# Patient Record
Sex: Female | Born: 1955 | Race: White | Hispanic: No | Marital: Married | Smoking: Current every day smoker
Health system: Southern US, Community
[De-identification: ages and names within clinical notes are randomized; demographics above are authoritative.]

## PROBLEM LIST (undated history)

## (undated) DIAGNOSIS — I341 Nonrheumatic mitral (valve) prolapse: Secondary | ICD-10-CM

## (undated) DIAGNOSIS — E538 Deficiency of other specified B group vitamins: Secondary | ICD-10-CM

## (undated) DIAGNOSIS — D510 Vitamin B12 deficiency anemia due to intrinsic factor deficiency: Secondary | ICD-10-CM

## (undated) HISTORY — PX: TONSILLECTOMY: SUR1361

## (undated) HISTORY — PX: URETHRAL STRICTURE DILATATION: SHX477

## (undated) HISTORY — PX: TUBAL LIGATION: SHX77

---

## 2020-04-07 ENCOUNTER — Encounter: Payer: Self-pay | Admitting: Emergency Medicine

## 2020-04-07 ENCOUNTER — Emergency Department: Payer: Medicare Other

## 2020-04-07 ENCOUNTER — Other Ambulatory Visit: Payer: Self-pay

## 2020-04-07 ENCOUNTER — Emergency Department
Admission: EM | Admit: 2020-04-07 | Discharge: 2020-04-07 | Disposition: A | Discharge status: Home / Self Care | Payer: Medicare Other | Attending: Emergency Medicine | Admitting: Emergency Medicine

## 2020-04-07 DIAGNOSIS — R609 Edema, unspecified: Secondary | ICD-10-CM | POA: Diagnosis not present

## 2020-04-07 DIAGNOSIS — F17210 Nicotine dependence, cigarettes, uncomplicated: Secondary | ICD-10-CM | POA: Insufficient documentation

## 2020-04-07 DIAGNOSIS — R2243 Localized swelling, mass and lump, lower limb, bilateral: Secondary | ICD-10-CM | POA: Diagnosis present

## 2020-04-07 DIAGNOSIS — R0602 Shortness of breath: Secondary | ICD-10-CM | POA: Insufficient documentation

## 2020-04-07 DIAGNOSIS — R7989 Other specified abnormal findings of blood chemistry: Secondary | ICD-10-CM

## 2020-04-07 HISTORY — DX: Vitamin B12 deficiency anemia due to intrinsic factor deficiency: D51.0

## 2020-04-07 HISTORY — DX: Nonrheumatic mitral (valve) prolapse: I34.1

## 2020-04-07 HISTORY — DX: Deficiency of other specified B group vitamins: E53.8

## 2020-04-07 LAB — CBC WITH DIFFERENTIAL/PLATELET
Abs Immature Granulocytes: 0.01 10*3/uL (ref 0.00–0.07)
Basophils Absolute: 0.1 10*3/uL (ref 0.0–0.1)
Basophils Relative: 1 %
Eosinophils Absolute: 0.2 10*3/uL (ref 0.0–0.5)
Eosinophils Relative: 3 %
HCT: 44.1 % (ref 36.0–46.0)
Hemoglobin: 14.6 g/dL (ref 12.0–15.0)
Immature Granulocytes: 0 %
Lymphocytes Relative: 26 %
Lymphs Abs: 2.1 10*3/uL (ref 0.7–4.0)
MCH: 29.8 pg (ref 26.0–34.0)
MCHC: 33.1 g/dL (ref 30.0–36.0)
MCV: 90 fL (ref 80.0–100.0)
Monocytes Absolute: 0.6 10*3/uL (ref 0.1–1.0)
Monocytes Relative: 7 %
Neutro Abs: 5.1 10*3/uL (ref 1.7–7.7)
Neutrophils Relative %: 63 %
Platelets: 240 10*3/uL (ref 150–400)
RBC: 4.9 MIL/uL (ref 3.87–5.11)
RDW: 13.6 % (ref 11.5–15.5)
WBC: 8.1 10*3/uL (ref 4.0–10.5)
nRBC: 0 % (ref 0.0–0.2)

## 2020-04-07 LAB — COMPREHENSIVE METABOLIC PANEL
ALT: 14 U/L (ref 0–44)
AST: 18 U/L (ref 15–41)
Albumin: 4.1 g/dL (ref 3.5–5.0)
Alkaline Phosphatase: 63 U/L (ref 38–126)
Anion gap: 10 (ref 5–15)
BUN: 26 mg/dL — ABNORMAL HIGH (ref 8–23)
CO2: 26 mmol/L (ref 22–32)
Calcium: 8.9 mg/dL (ref 8.9–10.3)
Chloride: 106 mmol/L (ref 98–111)
Creatinine, Ser: 1.73 mg/dL — ABNORMAL HIGH (ref 0.44–1.00)
GFR calc Af Amer: 35 mL/min — ABNORMAL LOW (ref >60)
GFR calc non Af Amer: 30 mL/min — ABNORMAL LOW (ref >60)
Glucose, Bld: 87 mg/dL (ref 70–99)
Potassium: 4.2 mmol/L (ref 3.5–5.1)
Sodium: 142 mmol/L (ref 135–145)
Total Bilirubin: 0.7 mg/dL (ref 0.3–1.2)
Total Protein: 7.4 g/dL (ref 6.5–8.1)

## 2020-04-07 LAB — BRAIN NATRIURETIC PEPTIDE: B Natriuretic Peptide: 65 pg/mL (ref 0.0–100.0)

## 2020-04-07 NOTE — Discharge Instructions (Signed)
The only significant abnormal lab findings were an elevated BUN and Creatinine, which are kidney numbers.   BUN 8 - 23 mg/dL 65KCLE    Creatinine, Ser 0.44 - 1.00 mg/dL 1.73High     This could be related to dehydration or multiple things. However, with your BP being high today and your swelling, it's important to have this re-checked with your Primary doctor.  Stand up and move around AT LEAST every 2-3 hours while driving

## 2020-04-07 NOTE — ED Triage Notes (Signed)
Patient presents to the ED with left leg swelling and occasional pain.  Patient denies history of blood clots.  Patient reports driving from [place] prior to swelling.

## 2020-04-07 NOTE — ED Provider Notes (Signed)
[facility] Emergency Department Provider Note  ____________________________________________   First MD Initiated Contact with Patient 04/07/20 1600     (approximate)  I have reviewed the triage vital signs and the nursing notes.   HISTORY  Chief Complaint Leg Swelling    HPI Amanda Weaver is a 64 y.o. female  Here with leg swelling.   The patient is currently visiting from [place].  She reportedly just drove down to visit family.  Per report, she often gets leg swelling when traveling.  However, over the last day, she has noticed worsening left leg swelling more than right, which is somewhat atypical for her.  Her daughter, who works in healthcare, told her to come to the ED due to concern for DVT.  Patient has no history of DVT/PE.  No history of kidney or heart issues.  Denies any recent immobilization or injuries.  She does try to get up, but admits to not getting up more than once or twice every 4-6 hours.  Denies any chest pain or shortness of breath.  No lightheadedness or dizziness.  No other acute complaints.       Past Medical History:  Diagnosis Date  . B12 deficiency   . Mitral valve prolapse   . Pernicious anemia     There are no problems to display for this patient.     Prior to Admission medications   Not on File    Allergies Patient has no allergy information on record.  No family history on file.  Social History Social History   Tobacco Use  . Smoking status: Current Every Day Smoker    Packs/day: 0.50    Types: Cigarettes  . Smokeless tobacco: Never Used  Substance Use Topics  . Alcohol use: Not Currently  . Drug use: Not on file    Review of Systems  Review of Systems  Constitutional: Negative for chills and fever.  HENT: Negative for sore throat.   Respiratory: Negative for shortness of breath.   Cardiovascular: Positive for leg swelling. Negative for chest pain.  Gastrointestinal: Negative for abdominal  pain.  Genitourinary: Negative for flank pain.  Musculoskeletal: Negative for neck pain.  Skin: Negative for rash and wound.  Allergic/Immunologic: Negative for immunocompromised state.  Neurological: Negative for weakness and numbness.  Hematological: Does not bruise/bleed easily.  All other systems reviewed and are negative.    ____________________________________________  PHYSICAL EXAM:      VITAL SIGNS: ED Triage Vitals  Enc Vitals Group     BP 04/07/20 1329 (!) 189/97     Pulse Rate 04/07/20 1329 (!) 104     Resp 04/07/20 1329 16     Temp 04/07/20 1329 98.3 F (36.8 C)     Temp Source 04/07/20 1329 Oral     SpO2 04/07/20 1329 95 %     Weight 04/07/20 1325 180 lb (81.6 kg)     Height 04/07/20 1325 5\' 2"  (1.575 m)     Head Circumference --      [facility] Flow --      Pain Score --      Pain Loc --      Pain Edu? --      Excl. in [place]? --      Physical Exam Vitals and nursing note reviewed.  Constitutional:      General: She is not in acute distress.    Appearance: She is well-developed.  HENT:     Head: Normocephalic and atraumatic.  Eyes:     Conjunctiva/sclera: Conjunctivae normal.  Cardiovascular:     Rate and Rhythm: Normal rate and regular rhythm.     Heart sounds: Normal heart sounds. No murmur. No friction rub.  Pulmonary:     Effort: Pulmonary effort is normal. No respiratory distress.     Breath sounds: Normal breath sounds. No wheezing or rales.  Abdominal:     General: There is no distension.     Palpations: Abdomen is soft.     Tenderness: There is no abdominal tenderness.  Musculoskeletal:     Cervical back: Neck supple.     Right lower leg: Edema present.     Left lower leg: Edema (Trace, non pitting) present.  Skin:    General: Skin is warm.     Capillary Refill: Capillary refill takes less than 2 seconds.  Neurological:     Mental Status: She is alert and oriented to person, place, and time.     Motor: No abnormal muscle tone.     Comments:  Strength 5/5 bl LE, normal sensation to light touch       ____________________________________________   LABS (all labs ordered are listed, but only abnormal results are displayed)  Labs Reviewed  COMPREHENSIVE METABOLIC PANEL - Abnormal; Notable for the following components:      Result Value   BUN 26 (*)    Creatinine, Ser 1.73 (*)    GFR calc non Af Amer 30 (*)    GFR calc Af Amer 35 (*)    All other components within normal limits  CBC WITH DIFFERENTIAL/PLATELET  BRAIN NATRIURETIC PEPTIDE    ____________________________________________  EKG: None ________________________________________  RADIOLOGY All imaging, including plain films, CT scans, and ultrasounds, independently reviewed by me, and interpretations confirmed via formal radiology reads.  ED MD interpretation:   US DVT: No DVT, likely lipoma noted  Official radiology report(s): US Venous Img Lower Unilateral Left  Result Date: 04/07/2020 CLINICAL DATA:  Left lower extremity pain and edema. Recent travel. History of smoking. Evaluate for DVT. EXAM: LEFT LOWER EXTREMITY VENOUS DOPPLER ULTRASOUND TECHNIQUE: Gray-scale sonography with graded compression, as well as color Doppler and duplex ultrasound were performed to evaluate the lower extremity deep venous systems from the level of the common femoral vein and including the common femoral, femoral, profunda femoral, popliteal and calf veins including the posterior tibial, peroneal and gastrocnemius veins when visible. The superficial great saphenous vein was also interrogated. Spectral Doppler was utilized to evaluate flow at rest and with distal augmentation maneuvers in the common femoral, femoral and popliteal veins. COMPARISON:  None. FINDINGS: Contralateral Common Femoral Vein: Respiratory phasicity is normal and symmetric with the symptomatic side. No evidence of thrombus. Normal compressibility. Common Femoral Vein: No evidence of thrombus. Normal compressibility,  respiratory phasicity and response to augmentation. Saphenofemoral Junction: No evidence of thrombus. Normal compressibility and flow on color Doppler imaging. Profunda Femoral Vein: No evidence of thrombus. Normal compressibility and flow on color Doppler imaging. Femoral Vein: No evidence of thrombus. Normal compressibility, respiratory phasicity and response to augmentation. Popliteal Vein: No evidence of thrombus. Normal compressibility, respiratory phasicity and response to augmentation. Calf Veins: No evidence of thrombus. Normal compressibility and flow on color Doppler imaging. Superficial Great Saphenous Vein: No evidence of thrombus. Normal compressibility. Venous Reflux:  None. Other Findings: There is an approximately 5.0 x 3.4 x 4.4 cm isoechoic ill-defined apparent mass within the subcutaneous tissues of the upper medial aspect of the left thigh which correlates with the  patient's palpable area of concern. This mass demonstrates similar echogenicity as the surrounding subcutaneous fat and thus is favored to represent a lipoma. No definitive septal wall thickening. No definitive communication with the overlying dermal surface. IMPRESSION: 1. No evidence of DVT within the left lower extremity. 2. Patient's palpable area of concern involving the upper medial aspect of the left thigh appears to correlate with an approximately 5.0 cm ill-defined isoechoic suspected lipoma. Electronically Signed   By: Simonne Come M.D.   On: 04/07/2020 14:30    ____________________________________________  PROCEDURES   Procedure(s) performed (including Critical Care):  Procedures  ____________________________________________  INITIAL IMPRESSION / MDM / ASSESSMENT AND PLAN / ED COURSE  As part of my medical decision making, I reviewed the following data within the electronic MEDICAL RECORD NUMBER Nursing notes reviewed and incorporated, Old chart reviewed, Notes from prior ED visits, and [place] Controlled Substance  Database       *Amanda Weaver was evaluated in Emergency Department on 04/07/2020 for the symptoms described in the history of present illness. She was evaluated in the context of the global COVID-19 pandemic, which necessitated consideration that the patient might be at risk for infection with the SARS-CoV-2 virus that causes COVID-19. Institutional protocols and algorithms that pertain to the evaluation of patients at risk for COVID-19 are in a state of rapid change based on information released by regulatory bodies including the CDC and federal and state organizations. These policies and algorithms were followed during the patient's care in the ED.  Some ED evaluations and interventions may be delayed as a result of limited staffing during the pandemic.*     Medical Decision Making: 64 year old female with past medical history as above here with minimal left asymmetric edema of the lower extremity in the setting of travel.  DVT study is negative.  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  No evidence of Baker's cyst or other abnormality.  She has fully intact distal pulses and strength and sensation on my exam.  I suspect this is likely dependent edema in the setting of a combination of likely venous insufficiency as well as positional venous stasis due to driving.  Of note, she does reportedly endorse increased thirst recently, which in the setting of her swelling, merited lab work.  Lab work reviewed, most notable for slightly elevated BUN/creatinine, which could suggest dehydration although in the setting of her swelling, I have encouraged her to follow-up with her PCP for monitoring of possible early CKD.  BNP is normal.  She does not appear hypervolemic otherwise.  She will follow-up with her PCP for this.  Encouraged her to stretch and elevate her legs frequently while driving and resting, and to follow-up with her PCP.  ____________________________________________  FINAL CLINICAL IMPRESSION(S) /  ED DIAGNOSES  Final diagnoses:  Peripheral edema  Elevated serum creatinine     MEDICATIONS GIVEN DURING THIS VISIT:  Medications - No data to display   ED Discharge Orders    None       Note:  This document was prepared using Dragon voice recognition software and may include unintentional dictation errors.   Shaune Pollack, MD 04/07/20 [phone]

## 2021-11-28 IMAGING — US US EXTREM LOW VENOUS*L*
1 series · 13 of 24 positions shown · non-contrast
Comparison: None.

CLINICAL DATA: Left lower extremity pain and edema. Recent travel.
History of smoking. Evaluate for DVT.



[Series 1: us venous img lower uni left (dvt) · portal-venous · 38 acquisitions, 13 frames shown]
[im 1/38]
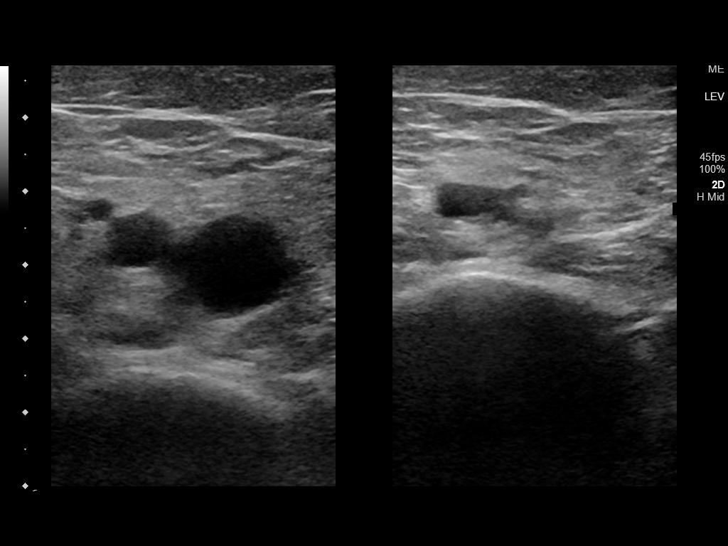
[im 4/38]
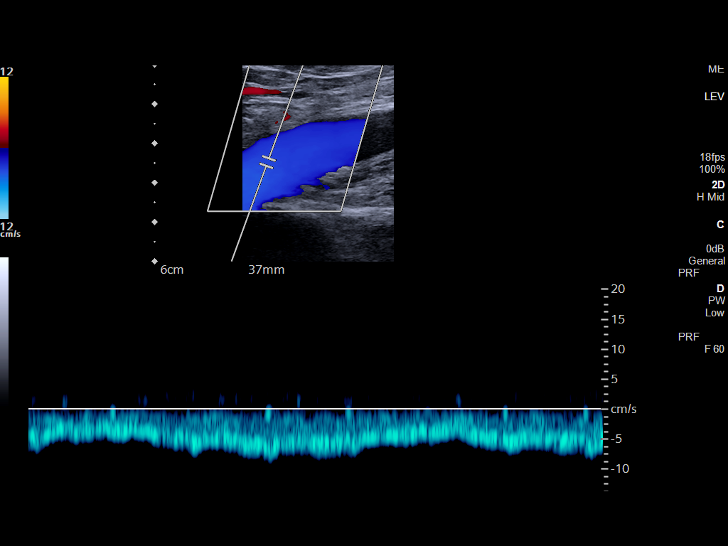
[im 7/38]
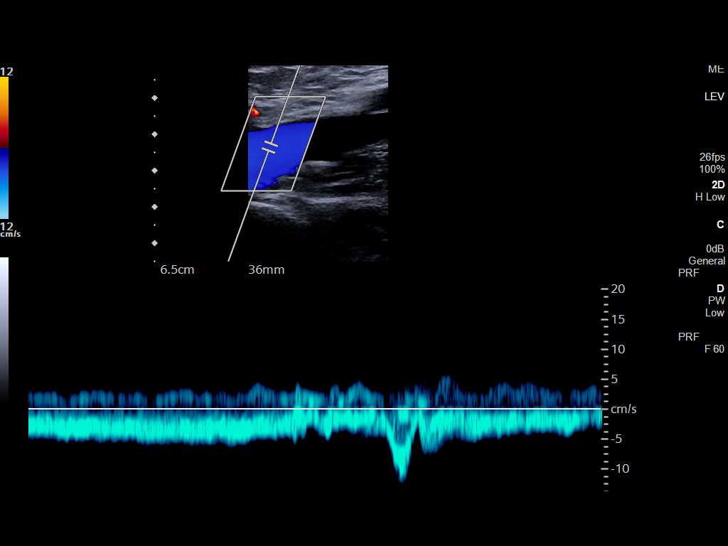
[im 10/38]
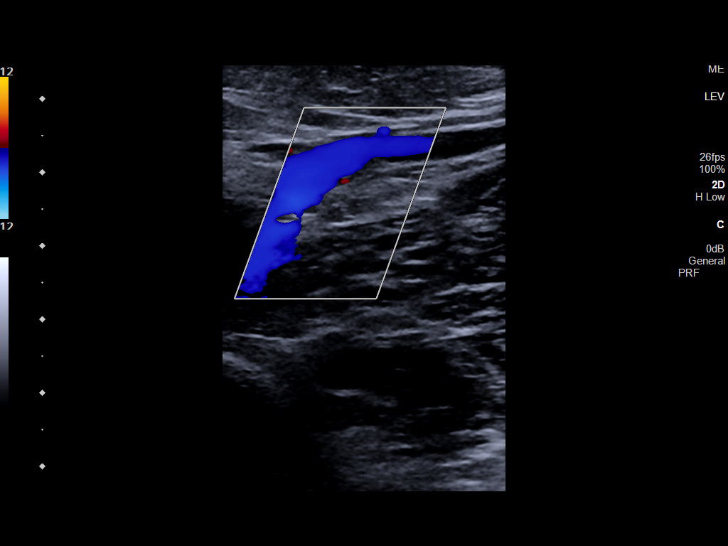
[im 13/38]
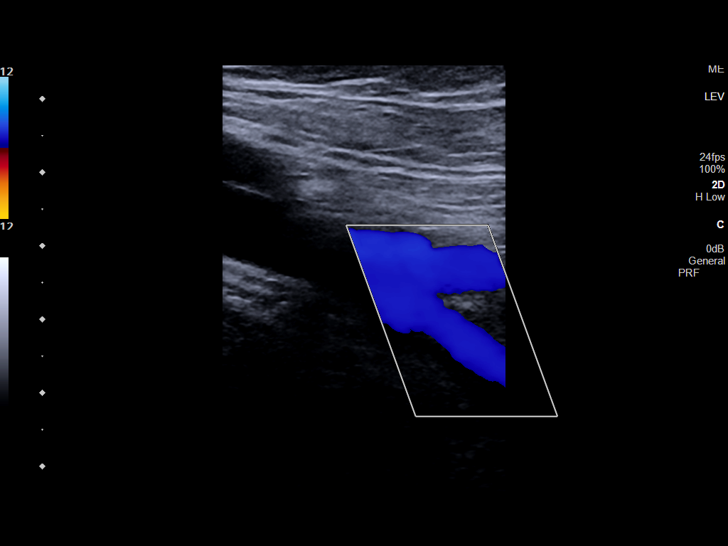
[im 17/38]
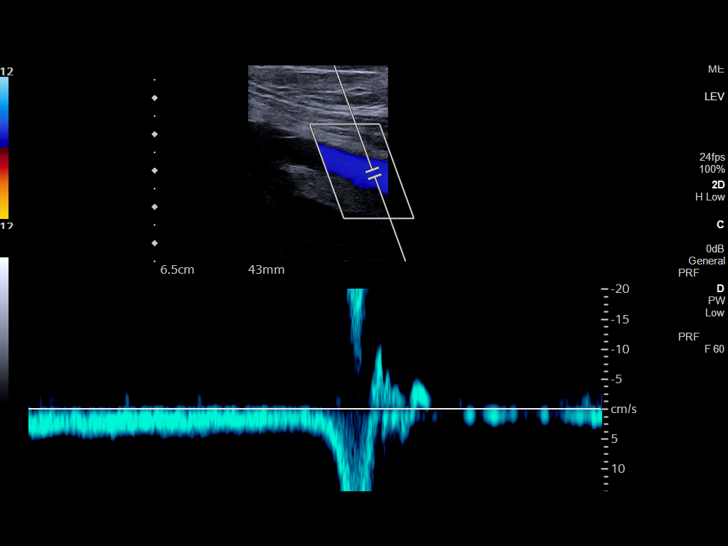
[im 21/38]
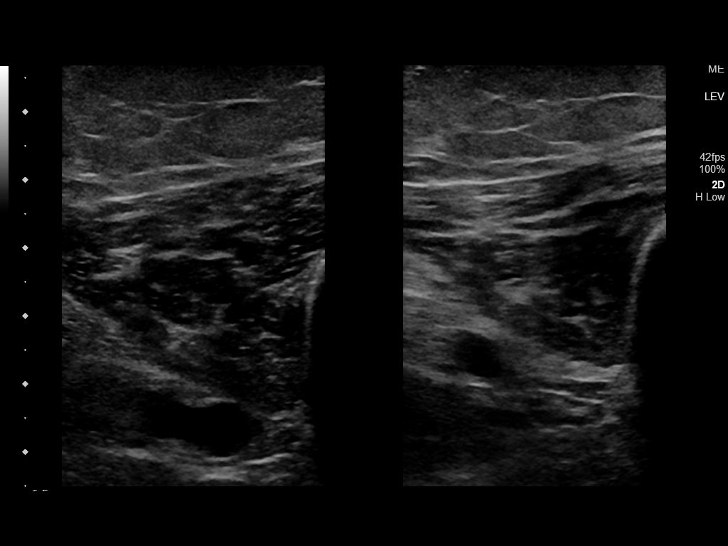
[im 23/38]
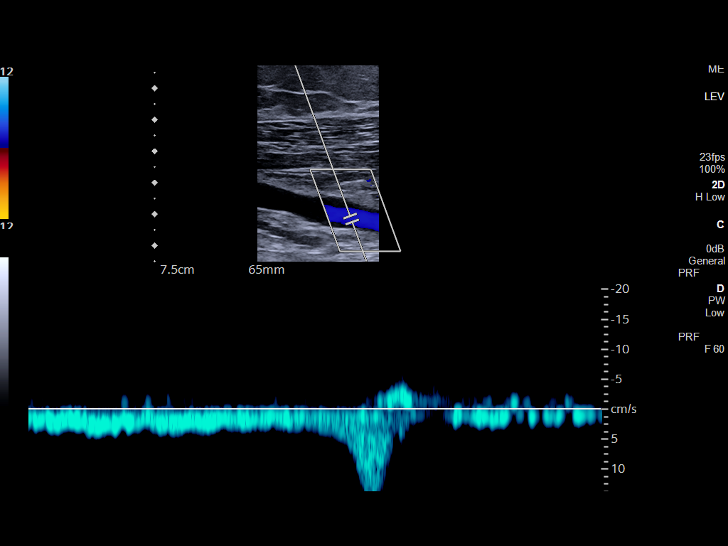
[im 26/38]
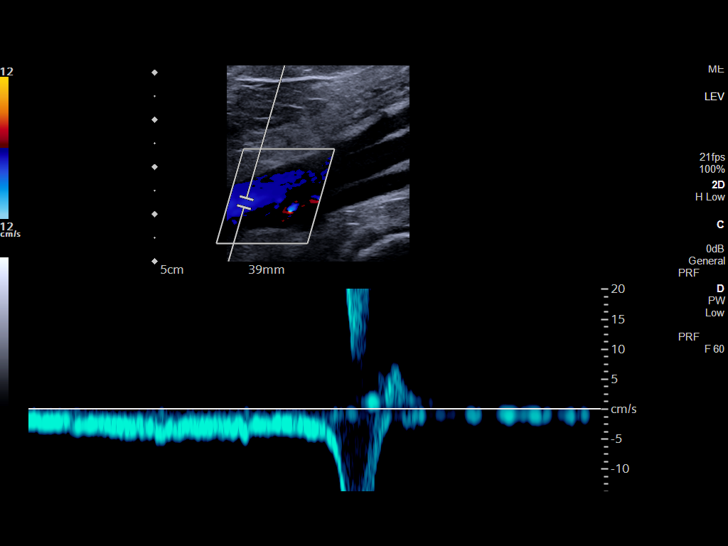
[im 29/38]
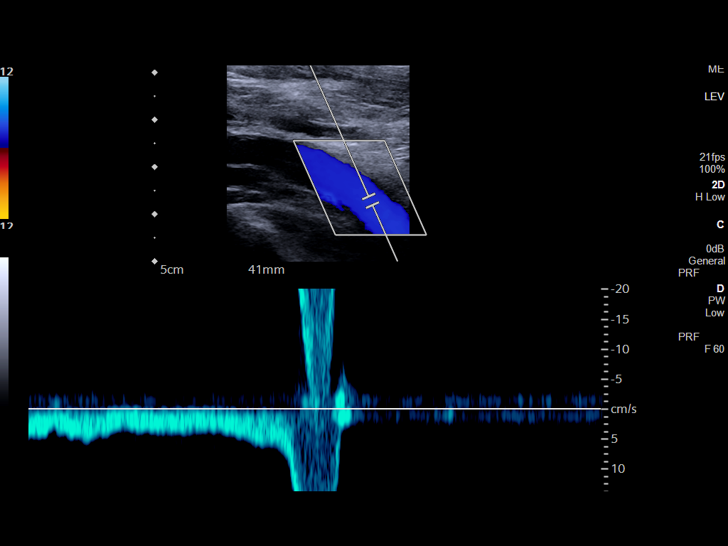
[im 33/38]
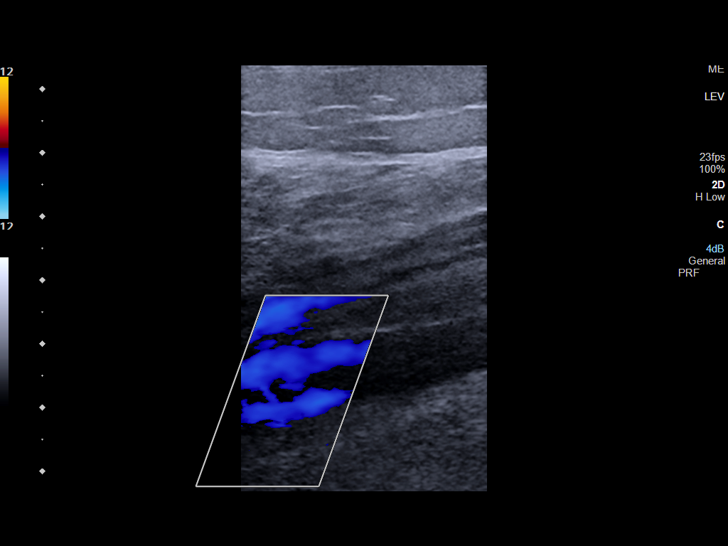
[im 36/38]
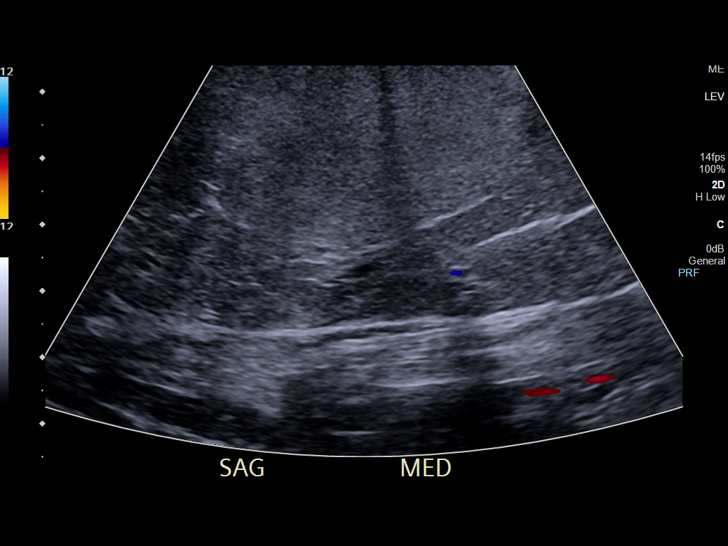
[im 38/38]
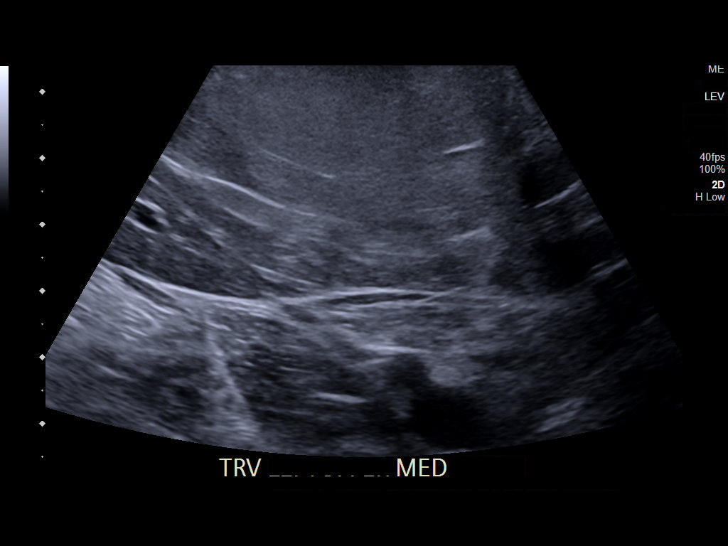

[13 of 24 positions shown; findings below may reference images not displayed]

FINDINGS: Contralateral Common Femoral Vein: Respiratory phasicity is normal
and symmetric with the symptomatic side. No evidence of thrombus.
Normal compressibility.

Common Femoral Vein: No evidence of thrombus. Normal
compressibility, respiratory phasicity and response to augmentation.

Saphenofemoral Junction: No evidence of thrombus. Normal
compressibility and flow on color Doppler imaging.

Profunda Femoral Vein: No evidence of thrombus. Normal
compressibility and flow on color Doppler imaging.

Femoral Vein: No evidence of thrombus. Normal compressibility,
respiratory phasicity and response to augmentation.

Popliteal Vein: No evidence of thrombus. Normal compressibility,
respiratory phasicity and response to augmentation.

Calf Veins: No evidence of thrombus. Normal compressibility and flow
on color Doppler imaging.

Superficial Great Saphenous Vein: No evidence of thrombus. Normal
compressibility.

Venous Reflux:  None.

Other Findings: There is an approximately 5.0 x 3.4 x 4.4 cm
isoechoic ill-defined apparent mass within the subcutaneous tissues
of the upper medial aspect of the left thigh which correlates with
the patient's palpable area of concern. This mass demonstrates
similar echogenicity as the surrounding subcutaneous fat and thus is
favored to represent a lipoma. No definitive septal wall thickening.
No definitive communication with the overlying dermal surface.
IMPRESSION: 1. No evidence of DVT within the left lower extremity.
2. Patient's palpable area of concern involving the upper medial
aspect of the left thigh appears to correlate with an approximately
5.0 cm ill-defined isoechoic suspected lipoma.

## 2023-05-14 LAB — COLOGUARD: COLOGUARD: NEGATIVE
